# Patient Record
Sex: Male | Born: 1980 | Race: Black or African American | Hispanic: No | Marital: Married | Smoking: Current every day smoker
Health system: Southern US, Community
[De-identification: ages and names within clinical notes are randomized; demographics above are authoritative.]

## PROBLEM LIST (undated history)

## (undated) DIAGNOSIS — I2699 Other pulmonary embolism without acute cor pulmonale: Secondary | ICD-10-CM

## (undated) HISTORY — PX: SPINAL FUSION: SHX223

---

## 2019-12-18 ENCOUNTER — Encounter (HOSPITAL_COMMUNITY): Payer: Self-pay | Admitting: Emergency Medicine

## 2019-12-18 ENCOUNTER — Emergency Department (HOSPITAL_COMMUNITY): Payer: Medicaid - Out of State

## 2019-12-18 ENCOUNTER — Other Ambulatory Visit: Payer: Self-pay

## 2019-12-18 DIAGNOSIS — R072 Precordial pain: Secondary | ICD-10-CM | POA: Diagnosis not present

## 2019-12-18 DIAGNOSIS — F17200 Nicotine dependence, unspecified, uncomplicated: Secondary | ICD-10-CM | POA: Insufficient documentation

## 2019-12-18 DIAGNOSIS — R0602 Shortness of breath: Secondary | ICD-10-CM | POA: Diagnosis not present

## 2019-12-18 DIAGNOSIS — R079 Chest pain, unspecified: Secondary | ICD-10-CM | POA: Diagnosis present

## 2019-12-18 LAB — CBC
HCT: 46.2 % (ref 39.0–52.0)
Hemoglobin: 14.5 g/dL (ref 13.0–17.0)
MCH: 27.1 pg (ref 26.0–34.0)
MCHC: 31.4 g/dL (ref 30.0–36.0)
MCV: 86.2 fL (ref 80.0–100.0)
Platelets: 254 10*3/uL (ref 150–400)
RBC: 5.36 MIL/uL (ref 4.22–5.81)
RDW: 14 % (ref 11.5–15.5)
WBC: 7.7 10*3/uL (ref 4.0–10.5)
nRBC: 0 % (ref 0.0–0.2)

## 2019-12-18 LAB — BASIC METABOLIC PANEL
Anion gap: 13 (ref 5–15)
BUN: 19 mg/dL (ref 6–20)
CO2: 21 mmol/L — ABNORMAL LOW (ref 22–32)
Calcium: 9.3 mg/dL (ref 8.9–10.3)
Chloride: 102 mmol/L (ref 98–111)
Creatinine, Ser: 1.09 mg/dL (ref 0.61–1.24)
GFR calc Af Amer: >60 mL/min (ref >60)
GFR calc non Af Amer: >60 mL/min (ref >60)
Glucose, Bld: 103 mg/dL — ABNORMAL HIGH (ref 70–99)
Potassium: 3.8 mmol/L (ref 3.5–5.1)
Sodium: 136 mmol/L (ref 135–145)

## 2019-12-18 LAB — TROPONIN I (HIGH SENSITIVITY): Troponin I (High Sensitivity): 4 ng/L (ref <18)

## 2019-12-18 LAB — D-DIMER, QUANTITATIVE: D-Dimer, Quant: <0.27 ug/mL-FEU (ref 0.00–0.50)

## 2019-12-18 NOTE — ED Triage Notes (Signed)
Pt c/o tightness in chest and shortness of breath. Pt concerned that he may have another blood clot, hx of PE. Pt currently on blood thinners.

## 2019-12-19 ENCOUNTER — Emergency Department (HOSPITAL_COMMUNITY)
Admission: EM | Admit: 2019-12-19 | Discharge: 2019-12-19 | Disposition: A | Discharge status: Home / Self Care | Payer: Medicaid - Out of State | Attending: Emergency Medicine | Admitting: Emergency Medicine

## 2019-12-19 ENCOUNTER — Emergency Department (HOSPITAL_COMMUNITY): Payer: Medicaid - Out of State

## 2019-12-19 DIAGNOSIS — R072 Precordial pain: Secondary | ICD-10-CM

## 2019-12-19 HISTORY — DX: Other pulmonary embolism without acute cor pulmonale: I26.99

## 2019-12-19 LAB — TROPONIN I (HIGH SENSITIVITY): Troponin I (High Sensitivity): 4 ng/L (ref <18)

## 2019-12-19 MED ORDER — PANTOPRAZOLE SODIUM 40 MG PO TBEC: 40.0000 mg | DELAYED_RELEASE_TABLET | Freq: Every day | ORAL | 0 refills | Status: AC

## 2019-12-19 MED ORDER — IOHEXOL 350 MG/ML SOLN
100.0000 mL | Freq: Once | INTRAVENOUS | Status: AC | PRN
  Administered 2019-12-19: 100 mL via INTRAVENOUS

## 2019-12-19 MED ORDER — ALUM & MAG HYDROXIDE-SIMETH 400-400-40 MG/5ML PO SUSP: 10.0000 mL | Freq: Four times a day (QID) | ORAL | 0 refills | Status: AC | PRN

## 2019-12-19 NOTE — Discharge Instructions (Signed)
You were seen in the ED today with chest pain. Your labs and CT scan did not show evidence of a heart attack or blood clot in the lungs. I have prescribed some medications to help with symptoms but will ask that you follow closely with your PCP. Return to the ED with any new or worsening symptoms.

## 2019-12-19 NOTE — ED Provider Notes (Signed)
Emergency Department Provider Note   I have reviewed the triage vital signs and the nursing notes.   HISTORY  Chief Complaint Chest Pain   HPI Calvin Robertson is a 39 y.o. male with PMH of prior PE on Eliquis presents to the ED with CP and SOB symptoms. He reports having concerns regarding another blood clot. He was seen at an outside hospital recently with similar pain. He describes seeing a Cardiologist and being cleared regarding ACS but does not have this documentation or tests or review. His pain is similar today but ahs worsened over the last several days. No fever or URI symptoms. He did recently miss 4 days of his Eliquis but has since been compliant. No pleuritic pain. Describes pain as a central chest pressure with no clear modifying factors or radiation.    Past Medical History:  Diagnosis Date  . Pulmonary embolism (HCC)     There are no problems to display for this patient.   Past Surgical History:  Procedure Laterality Date  . SPINAL FUSION      Allergies Bee venom  No family history on file.  Social History Social History   Tobacco Use  . Smoking status: Current Every Day Smoker  . Smokeless tobacco: Never Used  Substance Use Topics  . Alcohol use: Not Currently  . Drug use: Not on file    Review of Systems  Constitutional: No fever/chills Eyes: No visual changes. ENT: No sore throat. Cardiovascular: Positive chest pain. Respiratory: Positive shortness of breath. Gastrointestinal: No abdominal pain.  No nausea, no vomiting.  No diarrhea.  No constipation. Genitourinary: Negative for dysuria. Musculoskeletal: Negative for back pain. Skin: Negative for rash. Neurological: Negative for headaches, focal weakness or numbness.  10-point ROS otherwise negative.  ____________________________________________   PHYSICAL EXAM:  VITAL SIGNS: ED Triage Vitals [12/18/19 2121]  Enc Vitals Group     BP (!) 161/115     Pulse Rate 100     Resp (!) 22       Temp 98.9 F (37.2 C)     Temp Source Oral     SpO2 100 %     Weight 190 lb (86.2 kg)     Height 5\' 9"  (1.753 m)   Constitutional: Alert and oriented. Well appearing and in no acute distress. Eyes: Conjunctivae are normal. Head: Atraumatic. Nose: No congestion/rhinnorhea. Mouth/Throat: Mucous membranes are moist.  Neck: No stridor.  Cardiovascular: Tachycardia. Good peripheral circulation. Grossly normal heart sounds.   Respiratory: Normal respiratory effort.  No retractions. Lungs CTAB. Gastrointestinal: Soft and nontender. No distention.  Musculoskeletal: No lower extremity tenderness nor edema. No gross deformities of extremities. Neurologic:  Normal speech and language. Skin:  Skin is warm, dry and intact. No rash noted.  ____________________________________________   LABS (all labs ordered are listed, but only abnormal results are displayed)  Labs Reviewed  BASIC METABOLIC PANEL - Abnormal; Notable for the following components:      Result Value   CO2 21 (*)    Glucose, Bld 103 (*)    All other components within normal limits  CBC  D-DIMER, QUANTITATIVE (NOT AT [facility])  TROPONIN I (HIGH SENSITIVITY)  TROPONIN I (HIGH SENSITIVITY)   ____________________________________________  EKG   EKG Interpretation  Date/Time:  Tuesday December 18 2019 21:21:20 EDT Ventricular Rate:  99 PR Interval:  150 QRS Duration: 84 QT Interval:  330 QTC Calculation: 423 R Axis:   72 Text Interpretation: Normal sinus rhythm Normal ECG No STEMI Confirmed by  Alona Bene ([id number]) on 12/19/2019 12:24:08 AM       ____________________________________________  RADIOLOGY  DG Chest 2 View  Result Date: 12/18/2019 CLINICAL DATA:  Chest pain.  History of blood clots. EXAM: CHEST - 2 VIEW COMPARISON:  None. FINDINGS: The heart size and mediastinal contours are within normal limits. Both lungs are clear. The visualized skeletal structures are unremarkable. IMPRESSION: No active  cardiopulmonary disease. Electronically Signed   By: Burman Nieves M.D.   On: 12/18/2019 22:14   CT Angio Chest PE W and/or Wo Contrast  Result Date: 12/19/2019 CLINICAL DATA:  Chest tightness and shortness of breath. High probability for pulmonary embolus. Previous history of pulmonary embolus, currently on blood thinners. EXAM: CT ANGIOGRAPHY CHEST WITH CONTRAST TECHNIQUE: Multidetector CT imaging of the chest was performed using the standard protocol during bolus administration of intravenous contrast. Multiplanar CT image reconstructions and MIPs were obtained to evaluate the vascular anatomy. CONTRAST:  OMNIPAQUE IOHEXOL 350 MG/ML SOLN COMPARISON:  Chest radiograph 12/18/2019 FINDINGS: Cardiovascular: Good opacification of the central and segmental pulmonary arteries. No focal filling defects. No evidence of significant pulmonary embolus. Normal heart size. No pericardial effusions. Normal caliber thoracic aorta. Mediastinum/Nodes: Small esophageal hiatal hernia. Esophagus is decompressed. No significant lymphadenopathy. Lungs/Pleura: Focal scarring in the right lung base. Lungs are otherwise clear. No consolidation or edema. No pleural effusions. No pneumothorax. Airways are patent. Upper Abdomen: No acute abnormality. Musculoskeletal: No chest wall abnormality. No acute or significant osseous findings. Review of the MIP images confirms the above findings. IMPRESSION: 1. No evidence of significant pulmonary embolus. 2. Small esophageal hiatal hernia. 3. Focal scarring in the right lung base. Electronically Signed   By: Burman Nieves M.D.   On: 12/19/2019 01:57    ____________________________________________   PROCEDURES  Procedure(s) performed:   Procedures  None  ____________________________________________   INITIAL IMPRESSION / ASSESSMENT AND PLAN / ED COURSE  Pertinent labs & imaging results that were available during my care of the patient were reviewed by me and  considered in my medical decision making (see chart for details).   Patient presents to the ED with CP. Pain is atypical for PE. Recently has an interruption in Eliquis. Troponin negative x 2. No ischemic change on EKG. CTA ordered despite negative d-dimer as patient is high risk for PE and not appropriate to risk stratify based on d-dimer alone.   CTA reviewed. No PE. Small hiatal hernia noted. Will start mallox and protonix. Patient to f/u with PCP and continue Eliquis. Delta troponin is negative. Plan for discharge with strict ED return precautions discussed.    I reviewed all nursing notes, vitals, pertinent old records, EKGs, labs, imaging (as available).  ____________________________________________  FINAL CLINICAL IMPRESSION(S) / ED DIAGNOSES  Final diagnoses:  Precordial chest pain     MEDICATIONS GIVEN DURING THIS VISIT:  Medications  iohexol (OMNIPAQUE) 350 MG/ML injection 100 mL (100 mLs Intravenous Contrast Given 12/19/19 0144)     NEW OUTPATIENT MEDICATIONS STARTED DURING THIS VISIT:  Discharge Medication List as of 12/19/2019  2:06 AM    START taking these medications   Details  alum & mag hydroxide-simeth (MAALOX MAX) 400-400-40 MG/5ML suspension Take 10 mLs by mouth every 6 (six) hours as needed for indigestion., Starting Wed 12/19/2019, Print    pantoprazole (PROTONIX) 40 MG tablet Take 1 tablet (40 mg total) by mouth daily., Starting Wed 12/19/2019, Until Fri 01/18/2020, Print        Note:  This document was prepared using Dragon  voice recognition software and may include unintentional dictation errors.  Alona Bene, MD, [facility]    Haadi Santellan, Arlyss Repress, MD 12/19/19 [phone]

## 2021-01-17 IMAGING — DX DG CHEST 2V
2 series · 2 of 2 positions shown · non-contrast
Comparison: None.

CLINICAL DATA: Chest pain.  History of blood clots.

EXAM:
CHEST - 2 VIEW

[chest pa]
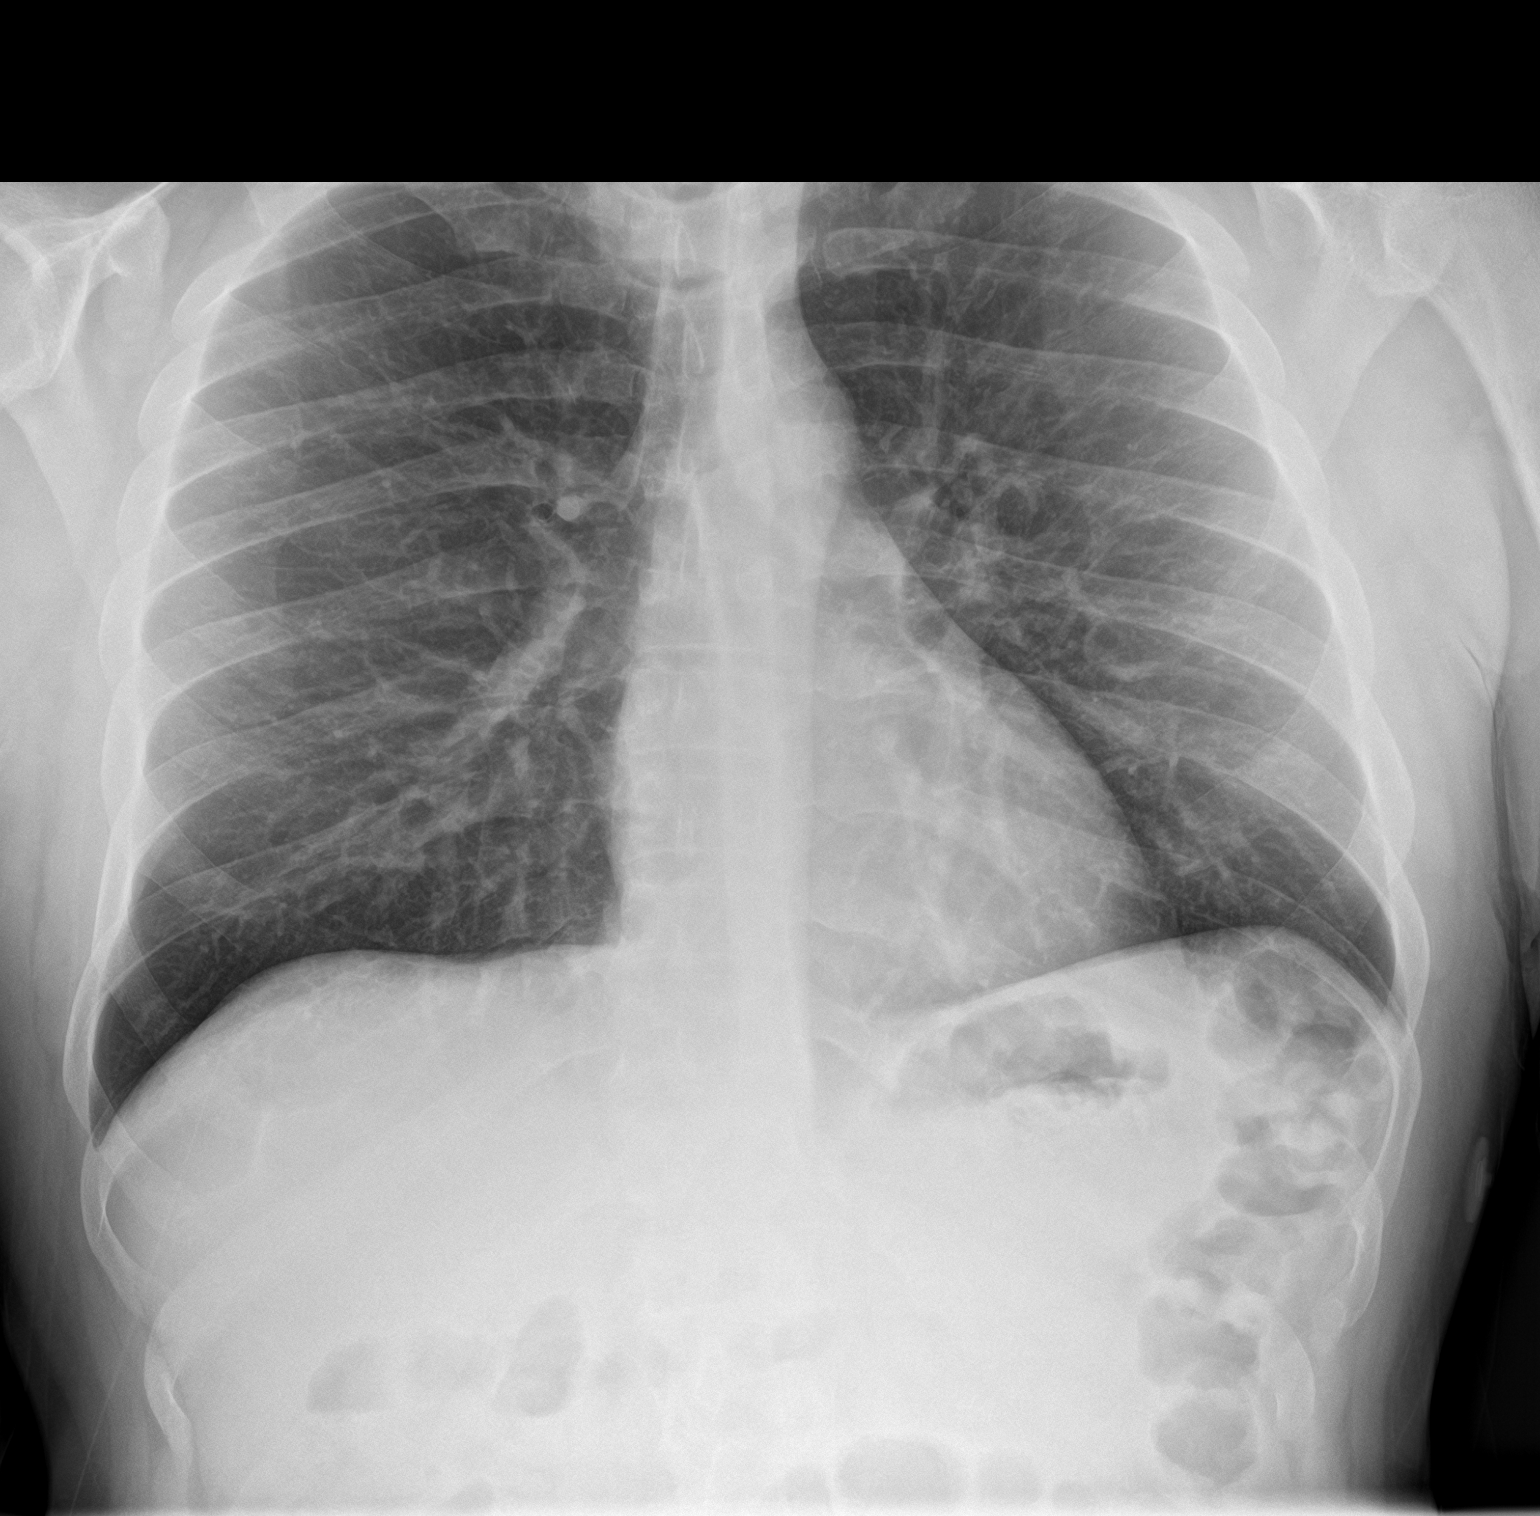

[chest lat]
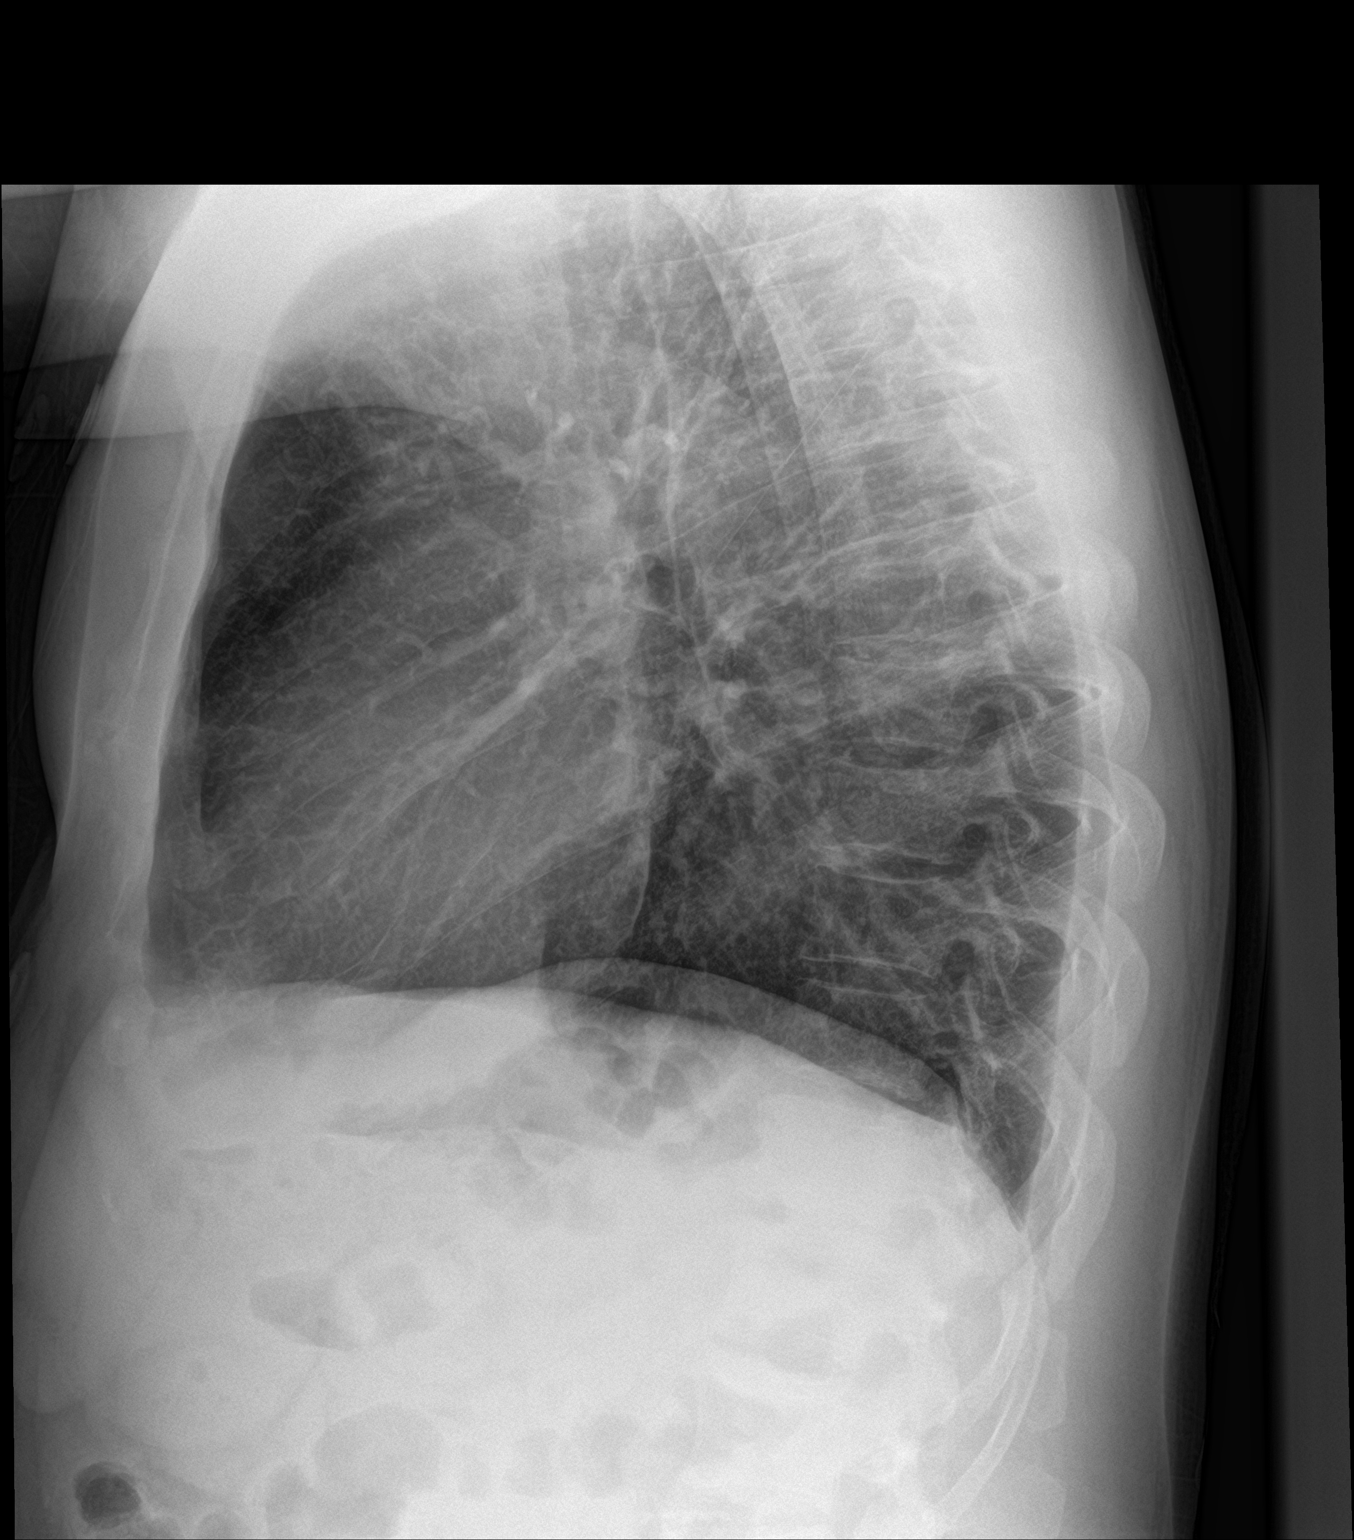

[2 of 2 positions shown; findings below may reference images not displayed]

FINDINGS: The heart size and mediastinal contours are within normal limits.
Both lungs are clear. The visualized skeletal structures are
unremarkable.
IMPRESSION: No active cardiopulmonary disease.
